# Patient Record
Sex: Female | Born: 1980 | Race: Black or African American | Hispanic: No | Smoking: Current every day smoker
Health system: Southern US, Community
[De-identification: ages and names within clinical notes are randomized; demographics above are authoritative.]

## PROBLEM LIST (undated history)

## (undated) DIAGNOSIS — F419 Anxiety disorder, unspecified: Secondary | ICD-10-CM

## (undated) DIAGNOSIS — R5383 Other fatigue: Secondary | ICD-10-CM

## (undated) DIAGNOSIS — G4720 Circadian rhythm sleep disorder, unspecified type: Secondary | ICD-10-CM

## (undated) DIAGNOSIS — R87610 Atypical squamous cells of undetermined significance on cytologic smear of cervix (ASC-US): Secondary | ICD-10-CM

## (undated) DIAGNOSIS — F329 Major depressive disorder, single episode, unspecified: Secondary | ICD-10-CM

## (undated) DIAGNOSIS — F32A Depression, unspecified: Secondary | ICD-10-CM

## (undated) DIAGNOSIS — F410 Panic disorder [episodic paroxysmal anxiety] without agoraphobia: Secondary | ICD-10-CM

## (undated) HISTORY — DX: Major depressive disorder, single episode, unspecified: F32.9

## (undated) HISTORY — DX: Anxiety disorder, unspecified: F41.9

## (undated) HISTORY — DX: Atypical squamous cells of undetermined significance on cytologic smear of cervix (ASC-US): R87.610

## (undated) HISTORY — DX: Other fatigue: R53.83

## (undated) HISTORY — DX: Circadian rhythm sleep disorder, unspecified type: G47.20

## (undated) HISTORY — DX: Panic disorder (episodic paroxysmal anxiety): F41.0

## (undated) HISTORY — DX: Depression, unspecified: F32.A

---

## 2004-10-20 ENCOUNTER — Emergency Department: Payer: Self-pay | Admitting: Unknown Physician Specialty

## 2006-05-13 ENCOUNTER — Emergency Department: Payer: Self-pay | Admitting: Emergency Medicine

## 2007-08-29 ENCOUNTER — Emergency Department: Payer: Self-pay | Admitting: Internal Medicine

## 2007-08-31 ENCOUNTER — Emergency Department: Payer: Self-pay | Admitting: Internal Medicine

## 2008-07-25 ENCOUNTER — Emergency Department: Payer: Self-pay | Admitting: Emergency Medicine

## 2008-07-29 IMAGING — US US OB < 14 WEEKS - US OB TV
1 series · 17 of 28 positions shown · non-contrast
Comparison: none

REASON FOR EXAM: preg and bleeding
COMMENTS:

[Series 1: us ob < 14 weeks - us ob tv · 17 of 83 slices shown]
[im 1/83]
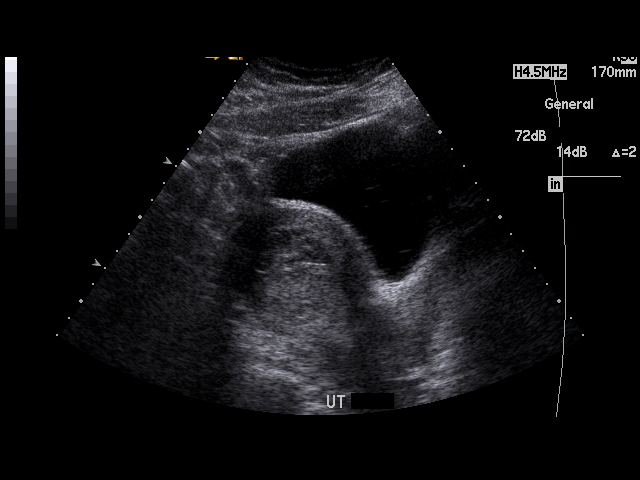
[im 7/83]
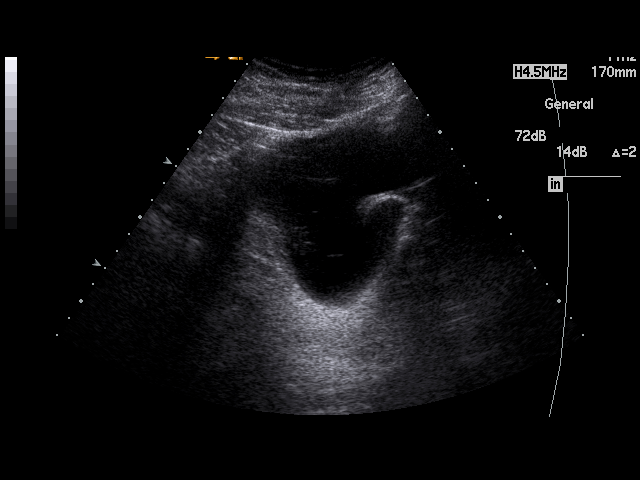
[im 13/83]
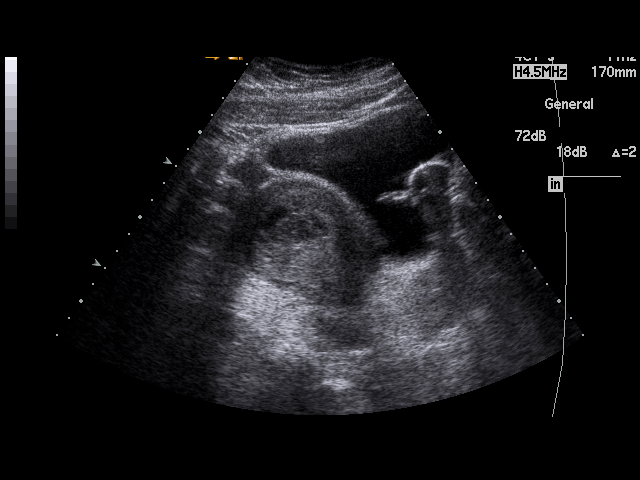
[im 16/83]
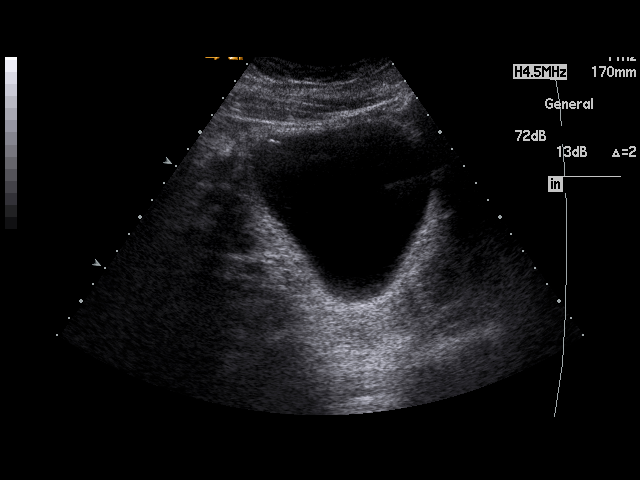
[im 22/83]
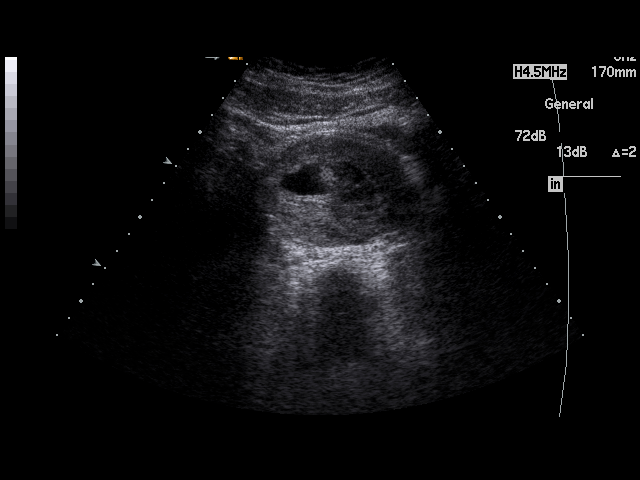
[im 28/83]
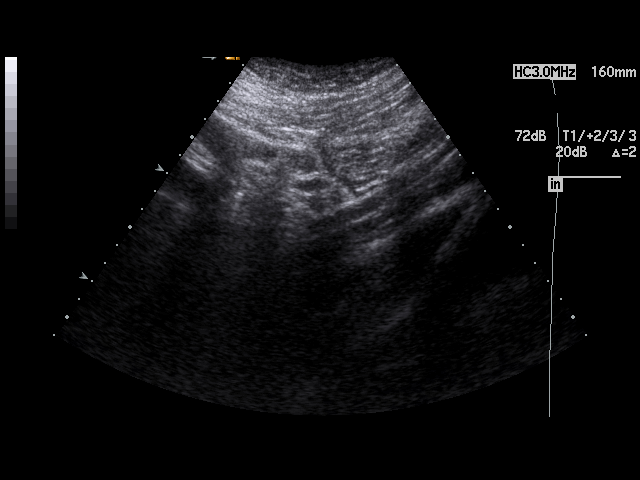
[im 31/83]
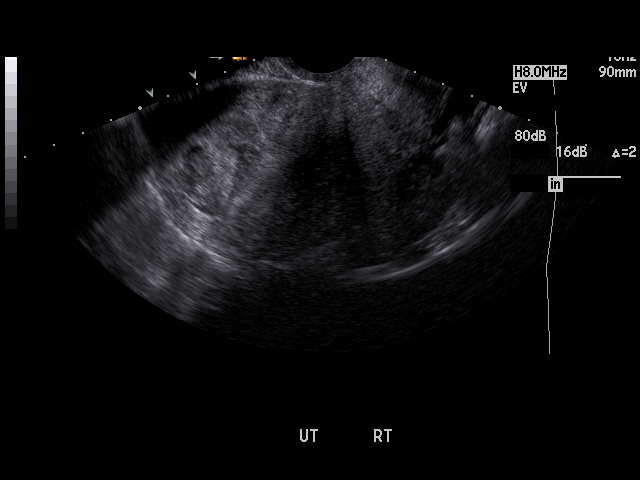
[im 37/83]
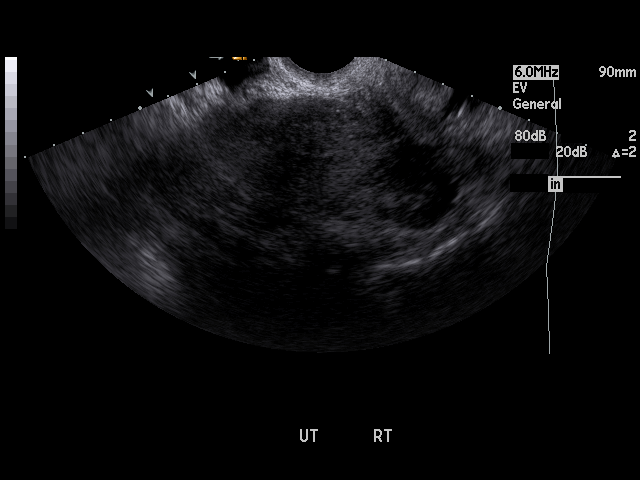
[im 43/83]
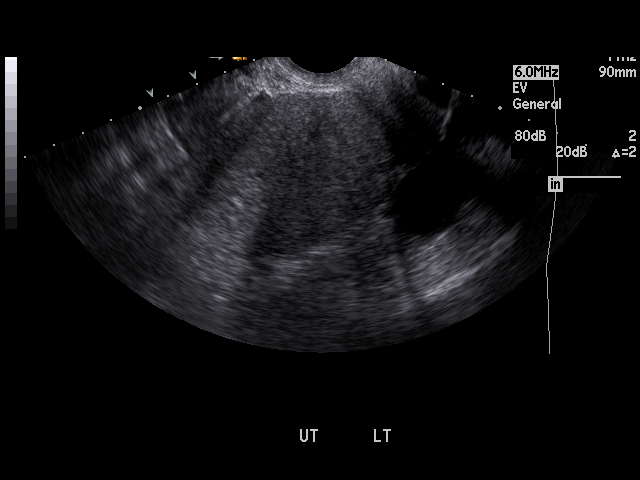
[im 46/83]
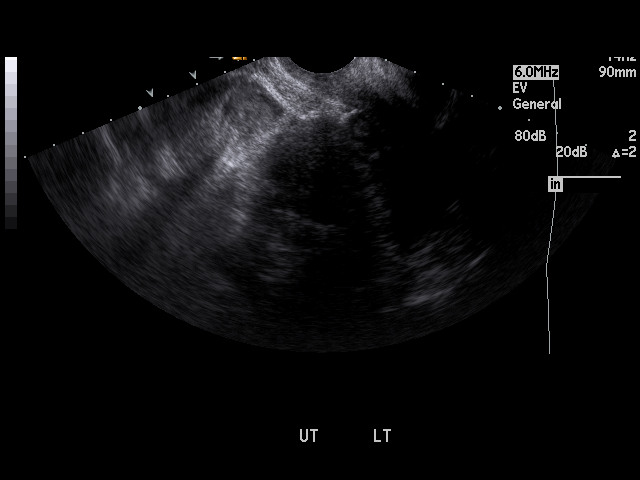
[im 52/83]
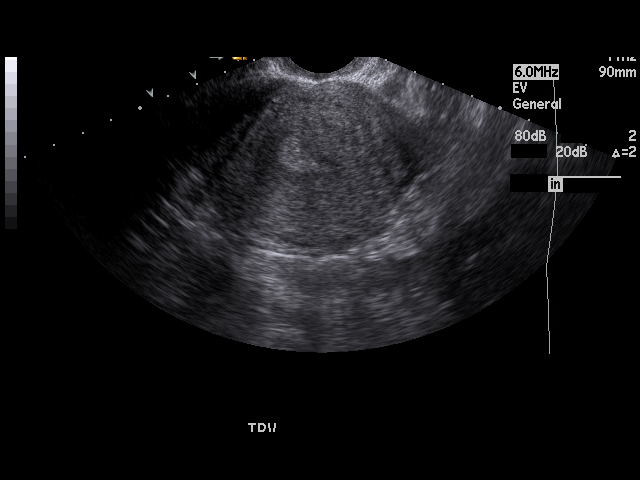
[im 55/83]
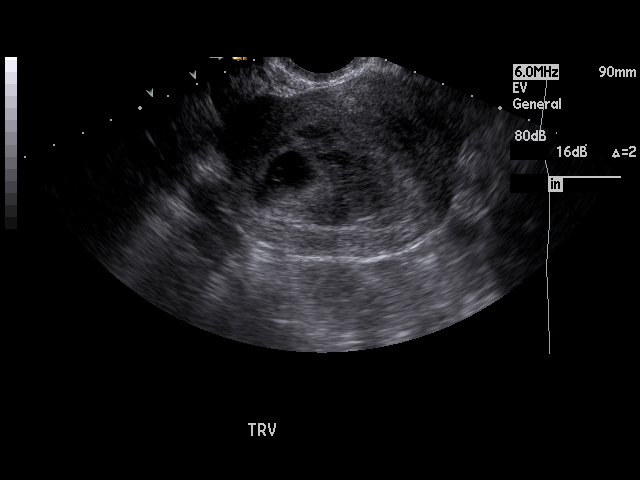
[im 61/83]
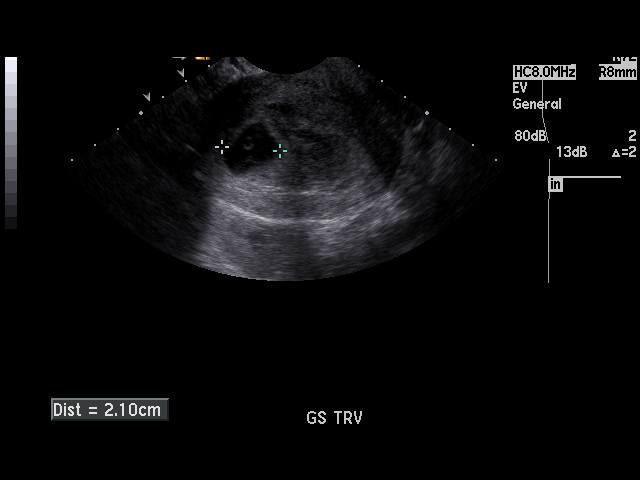
[im 67/83]
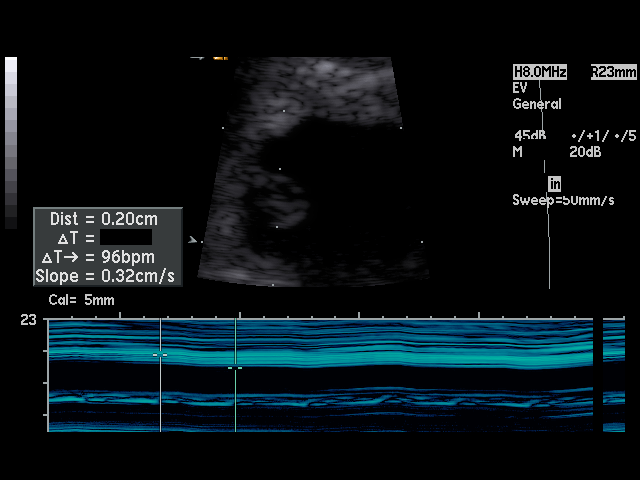
[im 70/83]
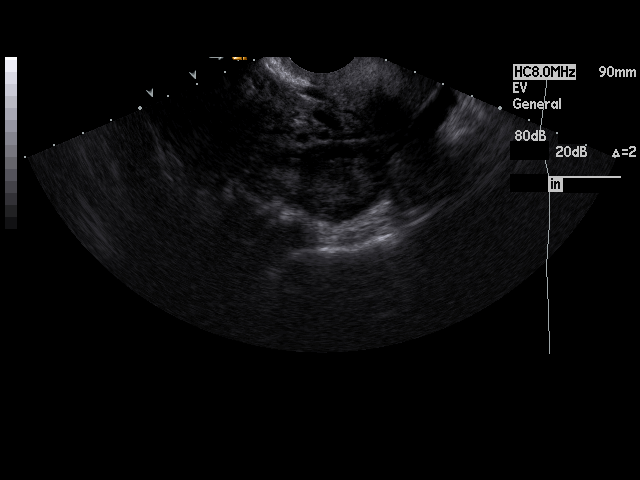
[im 76/83]
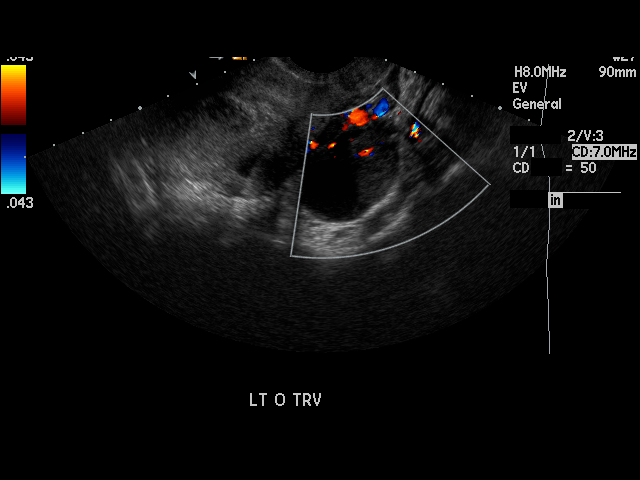
[im 83/83]
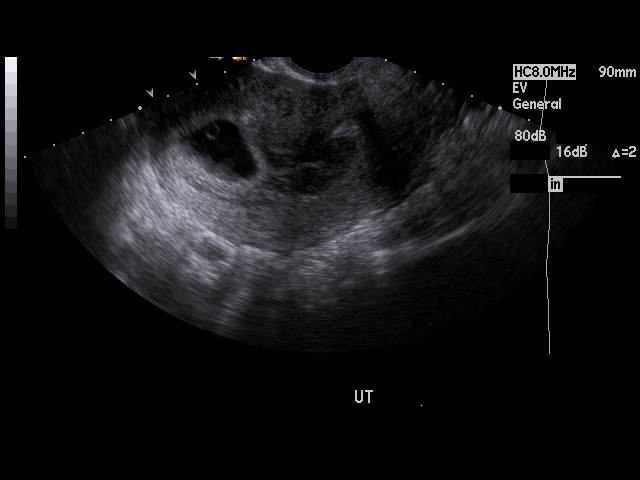

[17 of 28 positions shown; findings below may reference images not displayed]

PROCEDURE:     US  - US OB LESS THAN 14 WEEKS  - August 29, 2007 [DATE]

RESULT:     A single viable intrauterine pregnancy is present with a crown
rump length of 0.3 cm corresponding to 6 weeks.  Yolk sac is identified.
Fetal heart rate is 95 beats per minute. Follow-up is suggested to
demonstrate continued fetal development.  A 1.5 x 1.2 x 1.8 cm uterine
fibroid is noted.  There is a questionable small subchorionic hemorrhage
adjacent to the gestational sac.  Follow-up pelvic ultrasounds are suggested
for continued evaluation. A simple ovarian cyst is noted on the LEFT
measuring approximately 2 cm.
IMPRESSION: 1)Single viable intrauterine pregnancy with small implantation bleed.
Follow-up pelvic ultrasound is suggested.  Gynecologic consultation is
suggested.
The initial report was faxed to the patient's physician at the time of the
study.

## 2009-12-31 ENCOUNTER — Emergency Department: Payer: Self-pay | Admitting: Emergency Medicine

## 2012-08-05 DIAGNOSIS — R87610 Atypical squamous cells of undetermined significance on cytologic smear of cervix (ASC-US): Secondary | ICD-10-CM

## 2012-08-05 HISTORY — DX: Atypical squamous cells of undetermined significance on cytologic smear of cervix (ASC-US): R87.610

## 2013-07-06 ENCOUNTER — Emergency Department: Payer: Self-pay | Admitting: Emergency Medicine

## 2013-07-12 ENCOUNTER — Emergency Department: Payer: Self-pay | Admitting: Internal Medicine

## 2013-08-30 ENCOUNTER — Ambulatory Visit: Payer: Self-pay | Admitting: Podiatry

## 2013-09-05 HISTORY — PX: TUBAL LIGATION: SHX77

## 2014-05-24 ENCOUNTER — Observation Stay: Payer: Self-pay

## 2014-05-24 LAB — URINALYSIS, COMPLETE
BILIRUBIN, UR: NEGATIVE
Blood: NEGATIVE
Leukocyte Esterase: NEGATIVE
Nitrite: NEGATIVE
PH: 5 (ref 4.5–8.0)
Protein: 30
RBC,UR: 3 /HPF (ref 0–5)
SPECIFIC GRAVITY: 1.026 (ref 1.003–1.030)
WBC UR: 2 /HPF (ref 0–5)

## 2014-08-06 ENCOUNTER — Ambulatory Visit: Payer: Self-pay | Admitting: Obstetrics and Gynecology

## 2014-08-06 LAB — CBC WITH DIFFERENTIAL/PLATELET
BASOS ABS: 0.1 10*3/uL (ref 0.0–0.1)
Basophil %: 0.7 %
Eosinophil #: 0 10*3/uL (ref 0.0–0.7)
Eosinophil %: 0.5 %
HCT: 38.4 % (ref 35.0–47.0)
HGB: 12.6 g/dL (ref 12.0–16.0)
LYMPHS ABS: 1.5 10*3/uL (ref 1.0–3.6)
LYMPHS PCT: 19.1 %
MCH: 29.5 pg (ref 26.0–34.0)
MCHC: 32.9 g/dL (ref 32.0–36.0)
MCV: 90 fL (ref 80–100)
Monocyte #: 0.6 x10 3/mm (ref 0.2–0.9)
Monocyte %: 7.5 %
Neutrophil #: 5.5 10*3/uL (ref 1.4–6.5)
Neutrophil %: 72.2 %
PLATELETS: 189 10*3/uL (ref 150–440)
RBC: 4.29 10*6/uL (ref 3.80–5.20)
RDW: 13.8 % (ref 11.5–14.5)
WBC: 7.7 10*3/uL (ref 3.6–11.0)

## 2014-08-07 ENCOUNTER — Inpatient Hospital Stay: Payer: Self-pay | Admitting: Obstetrics and Gynecology

## 2014-08-08 LAB — HEMATOCRIT: HCT: 38.5 % (ref 35.0–47.0)

## 2014-12-29 LAB — SURGICAL PATHOLOGY

## 2014-12-31 NOTE — Op Note (Signed)
PATIENT NAME:  Angela Tapia, Angela Tapia MR#:  [id number] DATE OF BIRTH:  20-Sep-1980  DATE OF PROCEDURE:  08/07/2014  PREOPERATIVE DIAGNOSES:  1.  History of prior Cesarean section.  2.  Obesity in pregnancy.  3.  Desiring permanent sterilization.   POSTOPERATIVE DIAGNOSES:  1.  History of prior Cesarean section.  2.  Obesity in pregnancy.  3.  Desiring permanent sterilization.   OPERATION:  Repeat low transverse Cesarean section with bilateral tubal ligation.   ANESTHESIA: General.   SURGEON: Hildred LaserAnika Arantxa Piercey, MD.   ASSISTANYolanda Bonine: Melody Burr, CNM.    ESTIMATED BLOOD LOSS: 500.   OPERATIVE FLUIDS: 1600 mL.   URINE OUTPUT: 75 mL.   COMPLICATIONS: None.   FINDINGS: Normal appearing uterine outline, tubes, and ovaries.  There was clear amniotic fluid at rupture. Infant was in cephalic presentation in the left occiput anterior position. No nuchal cord was present.   SPECIMEN: Cord blood and portion of right and left fallopian tubes.   CONDITION: Stable.   PROCEDURE: The patient was taken to the operating room where she was placed under spinal anesthesia without difficulty. She was then placed in the supine position with leftward tilt and prepped and draped in normal sterile fashion. After this the spinal anesthesia was tested and found to be adequate. Next a Pfannenstiel skin incision was then made with a scalpel and carried down to the underlying layer of the fascia with the Bovie. The fascia was incised in the midline and the incision was extended laterally with the Mayo scissors. The superior aspect of the fascial incision was then grasped Kober clamps, elevated and the underlying rectus muscles were dissected off bluntly.  Attention was then turned to the inferior aspect of the incision which in a similar fashion was grasped, tented up with Kober clamps, and the rectus muscles were then dissected off bluntly. The rectus muscles were then separated in the midline and the peritoneum was identified  and entered bluntly. The peritoneal incision was then extended superiorly and inferiorly with good visualization of the bladder. The bladder blade was then inserted and the vesicouterine peritoneum was identified, grasped with pickups and entered sharply with the Metzenbaum scissors. The incision was then extended laterally and a bladder flap was created digitally. The bladder blade was then reinserted and the lower uterine segment was incised in transverse fashion with the scalpel. The uterine incision was then extended superiorly bluntly. The bladder blade was then removed and the infant's head was delivered atraumatically. The nose and mouth were suctioned and the remainder of the infant was delivered without difficulty. The cord was clamped and cut. The infant was handed off to the waiting pediatrician. Cord blood was obtained. The placenta was then removed manually and the uterus was exteriorized and cleared of all clots and debris. The uterine incision was then repaired using a 0 Vicryl in a running locked fashion. A second layer of the same suture was then used to obtain excellent hemostasis. Attention was then turned to the fallopian tubes. The left fallopian tube was then grasped with a Babcock clamp and the Babcock clamp was then used to grasp the tube approximately 4 cm from the cornual region. Next a 3 cm segment of tube was then ligated with a free tie of plain gut and excised, this was done in the [place] fashion. Good hemostasis was noted. Attention was then turned to the right fallopian tube which in a similar fashion was then ligated and a 3 cm segment excised. Excellent hemostasis was  noted. Both tubal ostia were cauterized bilaterally. The uterus was then returned to the abdomen. The gutters were cleared of all clots and the fascia was reapproximated with 0 Vicryl in a running fashion. The skin was then closed with 4-0 Monocryl. Dermabond was placed over the incision. Next a Lidoderm patch was  placed above and below the incision. The patient tolerated the procedure well. Sponge, lap, and needle counts were correct x 2.  Two grams of Ancef were given prior to incision. The patient was taken to the recovery room in stable condition.    ____________________________ Jacques Earthly Valentino Saxon, MD asc:bu D: 08/07/2014 17:38:37 ET T: 08/07/2014 18:37:42 ET JOB#: [id number]  cc: Jacques Earthly. Valentino Saxon, MD, <Dictator> Fabian November MD ELECTRONICALLY SIGNED 09/05/2014 9:50

## 2015-01-13 NOTE — H&P (Signed)
L&D Evaluation:  History:  HPI 33yo presents with c/o n/v and low back pain x 36 hours at 28weeks, denies fever or decreased FM; unable to keep anything in orally since yesterday. denies contractions or vaginal d/c or dysuria. Normal PNC to date   Presents with back pain, nausea/vomiting   Patient's Medical History Asthma   Patient's Surgical History Previous C-Section   Medications Pre Natal Vitamins  Tylenol (Acetaminophen)   Allergies NKDA   Social History none   Family History Non-Contributory   ROS:  ROS All systems were reviewed.  HEENT, CNS, GI, GU, Respiratory, CV, Renal and Musculoskeletal systems were found to be normal.   Exam:  Vital Signs stable   Urine Protein not completed   Mental Status clear   Chest clear   Heart normal sinus rhythm   Abdomen gravid, non-tender   Back no CVAT   FHT normal rate with no decels   Fetal Heart Rate 140   Ucx absent   Impression:  Impression dehydration, Gastritis at 28 weeks   Plan:  Plan EFM/NST, fluids, antiemetics   Electronic Signatures: Ines BloomerBurr, Katalina Magri N (CNM)  (Signed 19-Sep-15 11:36)  Authored: L&D Evaluation   Last Updated: 19-Sep-15 11:36 by Ulyses AmorBurr, Greysyn Vanderberg N (CNM)

## 2015-08-12 ENCOUNTER — Other Ambulatory Visit: Payer: Self-pay

## 2015-08-13 ENCOUNTER — Other Ambulatory Visit: Payer: Self-pay

## 2016-06-16 ENCOUNTER — Telehealth: Payer: Self-pay | Admitting: Obstetrics and Gynecology

## 2016-06-16 ENCOUNTER — Encounter: Payer: Self-pay | Admitting: Obstetrics and Gynecology

## 2016-06-16 NOTE — Telephone Encounter (Signed)
Yes tina she may do a drop off ua, just put [facility] nurse schedule and [facility] thanks, Ziyah Cordoba

## 2016-06-16 NOTE — Telephone Encounter (Signed)
Hey Tina yes she can drop off UA in am, just put on nurse schedule and [facility], thanks

## 2016-06-16 NOTE — Telephone Encounter (Signed)
LM for pt letting her know that she can do the UA drop off

## 2016-06-16 NOTE — Telephone Encounter (Signed)
PT CALLED AND SHE THINKS SHE MIGHT HAVE A UTI, SHE IS HAVING LOWER ABDOMEN PAIN, SHE WAS UNABLE TO MAKE IT TODAY AT 9:45 AT THE OPEN SLOT AND THERE IS NOTHING OPEN UNTIL November, SO I DIDN'T KNOW IF SHE COULD COME IN AND DROP OFF HER URINE, SHE SAID SHE CAN DO IT TOMORROW MORNING IF SHE CAN. LET ME KNOW AND I CAN CALL HER BACK.

## 2016-09-13 ENCOUNTER — Encounter: Payer: Self-pay | Admitting: Certified Nurse Midwife

## 2016-09-20 ENCOUNTER — Encounter: Payer: Self-pay | Admitting: Certified Nurse Midwife

## 2016-09-20 ENCOUNTER — Ambulatory Visit (INDEPENDENT_AMBULATORY_CARE_PROVIDER_SITE_OTHER): Payer: Self-pay | Admitting: Certified Nurse Midwife

## 2016-09-20 ENCOUNTER — Other Ambulatory Visit: Payer: Self-pay | Admitting: Certified Nurse Midwife

## 2016-09-20 VITALS — BP 121/78 | HR 81 | Ht 60.0 in | Wt 233.5 lb

## 2016-09-20 DIAGNOSIS — Z01419 Encounter for gynecological examination (general) (routine) without abnormal findings: Secondary | ICD-10-CM

## 2016-09-20 DIAGNOSIS — Z113 Encounter for screening for infections with a predominantly sexual mode of transmission: Secondary | ICD-10-CM

## 2016-09-20 NOTE — Progress Notes (Signed)
ANNUAL PREVENTATIVE CARE GYN  ENCOUNTER NOTE  Subjective:       Angela Tapia is a 36 y.o. [id number] female here for a routine annual gynecologic exam.  Current complaints: fatigue, mood changes, left thigh skin tag and right arm pit swelling.   [name] reports that this year has been busy, but okay. She reports increased fatigue, feeling tired and "not being there" from time to time and requests lab work today.   Her stress level increased when her mother moved in with her for a while, but she has since moved out and things are getting better.   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   She is sexual activity and requests STI testing. Last intercourse 09/19/2016.  She has three children: boys, eight (8) and 613, and a daughter age two (2).   Gynecologic History  Patient's last menstrual period was 08/26/2016 (approximate).   Contraception: tubal ligation   Last Pap: 2015. Results were: abnormal  Obstetric History OB History  Gravida Para Term Preterm AB Living  4       1    SAB TAB Ectopic Multiple Live Births  1 0          # Outcome Date GA Lbr Len/2nd Weight Sex Delivery Anes PTL Lv  4 Gravida 2015     CS-Unspec     3 Gravida 2009    M CS-Unspec     2 Gravida 2004     CS-Unspec     1 SAB               Past Medical History:  Diagnosis Date  . Anxiety   . ASCUS of cervix with negative high risk HPV 08/2012  . Depression   . Disturbed sleep rhythm   . Fatigue   . Panic attack     Past Surgical History:  Procedure Laterality Date  . CESAREAN SECTION  2009  . TUBAL LIGATION  2015    No current outpatient prescriptions on file prior to visit.   No current facility-administered medications on file prior to visit.     Not on File  Social History   Social History  . Marital status: Legally Separated    Spouse name: N/A  . Number of children: N/A  . Years of education: N/A   Occupational History  . Not on file.   Social History Main Topics  .  Smoking status: Current Every Day Smoker    Packs/day: 3.00    Years: 20.00  . Smokeless tobacco: Never Used  . Alcohol use Yes     Comment: rarely  . Drug use: No  . Sexual activity: Yes    Birth control/ protection: Surgical   Other Topics Concern  . Not on file   Social History Narrative  . No narrative on file    Family History  Problem Relation Age of Onset  . Diabetes Mother   . Hypertension Mother   . Heart disease Maternal Grandmother   . Diabetes Maternal Grandmother   . Diabetes Paternal Grandmother     The following portions of the patient's history were reviewed and updated as appropriate: allergies, current medications, past family history, past medical history, past social history, past surgical history and problem list.  Review of Systems ROS  General ROS: negative for - chills,fever, hot flashes, night sweats, weight gain or weight loss  Psychological ROS: negative for - anxiety, decreased libido, depression, physical abuse or sexual abuse  Ophthalmic  ROS: negative for - blurry vision, eye pain or loss of vision  ENT ROS: negative for - headaches, hearing change, visual changes or vocal changes  Hematological and Lymphatic ROS: negative for - bleeding problems, bruising, swollen lymph nodes or weight loss  Endocrine ROS: negative for - galactorrhea, hair pattern changes, hot flashes, palpitations, polydipsia/polyuria,  temperature intolerance or unexpected weight changes  Breast ROS: negative for - new or changing breast lumps or nipple discharge  Respiratory ROS: negative for - cough or shortness of breath  Cardiovascular ROS: negative for - chest pain, irregular heartbeat, palpitations or shortness of breath  Gastrointestinal ROS: no abdominal pain, change in bowel habits, or black or bloody stools  Genito-Urinary ROS: no dysuria, trouble voiding, or hematuria  Musculoskeletal ROS: negative for - joint pain or joint stiffness  Neurological ROS:  negative for - bowel and bladder control changes  Dermatological ROS: negative for rash changes   Objective:   BP 121/78   Pulse 81   Ht 5' (1.524 m)   Wt 233 lb 8 oz (105.9 kg)   LMP 08/26/2016 (Approximate)   BMI 45.60 kg/m    CONSTITUTIONAL: Well-developed, well-nourished female in no acute distress.   PSYCHIATRIC: Normal mood and affect. Normal behavior. Normal judgment and thought content.  NEUROLGIC: Alert and oriented to person, place, and time. Normal muscle tone coordination.  HENT:  Normocephalic, atraumatic.   NECK: Normal range of motion, supple, no masses.  Normal thyroid.   SKIN: Skin is warm and dry. No rash noted. Not diaphoretic. No erythema. No pallor. Flesh colored skin tag to inner left thigh size of the tip of a ballpoint pen.   CARDIOVASCULAR: Normal heart rate noted, regular rhythm, no murmur.  RESPIRATORY: Clear to auscultation bilaterally. Effort and breath sounds normal.  BREASTS: Symmetric in size. No masses, skin changes, nipple drainage, or lymphadenopathy.  ABDOMEN: Soft, normal bowel sounds, no distention noted.  No tenderness, rebound or guarding.   PELVIC:  External Genitalia: Normal  Vagina: Normal, yellow thin discharge present  Cervix: Normal  Uterus: Normal  Adnexa: Normal   MUSCULOSKELETAL: Normal range of motion. No tenderness.    LYMPHATIC: No Supraclavicular, or Inguinal Adenopathy. Right > left axilla tissue  Assessment:   Annual gynecologic examination 36 y.o.   Contraception: tubal ligation   Obesity 2   Problem List Items Addressed This Visit    None    Visit Diagnoses    Well woman exam    -  Primary   Relevant Orders   CBC with Differential/Platelet   Comprehensive metabolic panel   Lipid panel   VITAMIN D 25 Hydroxy (Vit-D Deficiency, Fractures)   Thyroid Panel With TSH   Hemoglobin A1c   Screening for STD (sexually transmitted disease)          Plan:  1. Pap: Pap, Reflex if ASCUS, Pap Co Test and  GC/CT NAAT  2. Labs: Vitamin D, CBC, CMP, Lipid 1, FBS, TSH and Hemoglobin A1C   3. Routine preventative health maintenance measures emphasized: Exercise/Diet/Weight control and Tobacco Warnings  4. Dermatology for skin evaluation  Return to Clinic - 1 Year for annual exam   Gunnar Bulla, CNM

## 2016-09-20 NOTE — Progress Notes (Signed)
Pt is due for pap  smear.

## 2016-09-22 ENCOUNTER — Other Ambulatory Visit: Payer: Self-pay

## 2016-09-23 ENCOUNTER — Other Ambulatory Visit: Payer: Self-pay | Admitting: Certified Nurse Midwife

## 2016-09-23 NOTE — Addendum Note (Signed)
Addended by: Rosine BeatLONTZ, Cadence Haslam L on: 09/23/2016 12:39 PM   Modules accepted: Orders

## 2016-09-26 LAB — CYTOLOGY - PAP

## 2016-09-27 ENCOUNTER — Telehealth: Payer: Self-pay | Admitting: Certified Nurse Midwife

## 2016-09-27 NOTE — Telephone Encounter (Signed)
Called pt with results. No answer, unable to leave voicemail.

## 2016-12-15 ENCOUNTER — Other Ambulatory Visit: Payer: Self-pay

## 2016-12-16 ENCOUNTER — Other Ambulatory Visit: Payer: Self-pay

## 2017-05-26 ENCOUNTER — Telehealth: Payer: Self-pay | Admitting: Certified Nurse Midwife

## 2017-05-26 NOTE — Telephone Encounter (Signed)
Tried to call pt back on cell - caller states wrong number. Lm on home number for pt to call me. Had tdap 05/27/2017. Records printed.

## 2017-05-26 NOTE — Telephone Encounter (Signed)
Patient would like to know if she has has the t-dap shot in the past, and also wants to retrieve her shot records if possible, Patient would like to have a call back from a nurse to discuss her questions, Please advise.

## 2017-05-29 NOTE — Telephone Encounter (Signed)
lmtrc on home number. Cell not correct.

## 2017-05-30 NOTE — Telephone Encounter (Signed)
lmtrc

## 2017-05-31 NOTE — Telephone Encounter (Signed)
Tried to contact pt x3. Chart filed.

## 2019-07-09 ENCOUNTER — Ambulatory Visit: Payer: Self-pay

## 2020-02-07 ENCOUNTER — Other Ambulatory Visit: Payer: Self-pay

## 2020-02-07 ENCOUNTER — Emergency Department
Admission: EM | Admit: 2020-02-07 | Discharge: 2020-02-07 | Disposition: A | Discharge status: Home / Self Care | Payer: BC Managed Care – PPO | Attending: Emergency Medicine | Admitting: Emergency Medicine

## 2020-02-07 DIAGNOSIS — M549 Dorsalgia, unspecified: Secondary | ICD-10-CM | POA: Insufficient documentation

## 2020-02-07 DIAGNOSIS — M545 Low back pain: Secondary | ICD-10-CM | POA: Diagnosis not present

## 2020-02-07 DIAGNOSIS — M25512 Pain in left shoulder: Secondary | ICD-10-CM | POA: Diagnosis not present

## 2020-02-07 DIAGNOSIS — M25511 Pain in right shoulder: Secondary | ICD-10-CM | POA: Insufficient documentation

## 2020-02-07 DIAGNOSIS — Y92410 Unspecified street and highway as the place of occurrence of the external cause: Secondary | ICD-10-CM | POA: Diagnosis not present

## 2020-02-07 DIAGNOSIS — F17200 Nicotine dependence, unspecified, uncomplicated: Secondary | ICD-10-CM | POA: Insufficient documentation

## 2020-02-07 DIAGNOSIS — Y9389 Activity, other specified: Secondary | ICD-10-CM | POA: Diagnosis not present

## 2020-02-07 DIAGNOSIS — M79622 Pain in left upper arm: Secondary | ICD-10-CM | POA: Insufficient documentation

## 2020-02-07 DIAGNOSIS — Y999 Unspecified external cause status: Secondary | ICD-10-CM | POA: Insufficient documentation

## 2020-02-07 DIAGNOSIS — M542 Cervicalgia: Secondary | ICD-10-CM | POA: Insufficient documentation

## 2020-02-07 MED ORDER — CYCLOBENZAPRINE HCL 5 MG PO TABS: ORAL_TABLET | ORAL | 0 refills | Status: DC

## 2020-02-07 MED ORDER — IBUPROFEN 600 MG PO TABS: 600.0000 mg | ORAL_TABLET | Freq: Four times a day (QID) | ORAL | 0 refills | Status: DC | PRN
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
## 2020-02-07 NOTE — ED Notes (Signed)
Pt discharged home after verbalizing understanding of discharge instructions; nad noted. 
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
## 2020-07-29 ENCOUNTER — Other Ambulatory Visit: Payer: Self-pay

## 2020-07-29 ENCOUNTER — Ambulatory Visit
Admission: EM | Admit: 2020-07-29 | Discharge: 2020-07-29 | Disposition: A | Discharge status: Home / Self Care | Payer: Self-pay | Attending: Emergency Medicine | Admitting: Emergency Medicine

## 2020-07-29 DIAGNOSIS — B9689 Other specified bacterial agents as the cause of diseases classified elsewhere: Secondary | ICD-10-CM | POA: Insufficient documentation

## 2020-07-29 DIAGNOSIS — B3731 Acute candidiasis of vulva and vagina: Secondary | ICD-10-CM

## 2020-07-29 DIAGNOSIS — N760 Acute vaginitis: Secondary | ICD-10-CM | POA: Insufficient documentation

## 2020-07-29 DIAGNOSIS — B373 Candidiasis of vulva and vagina: Secondary | ICD-10-CM | POA: Insufficient documentation

## 2020-07-29 DIAGNOSIS — A5901 Trichomonal vulvovaginitis: Secondary | ICD-10-CM | POA: Insufficient documentation

## 2020-07-29 LAB — URINALYSIS, COMPLETE (UACMP) WITH MICROSCOPIC
Bilirubin Urine: NEGATIVE
Glucose, UA: NEGATIVE mg/dL
Hgb urine dipstick: NEGATIVE
Nitrite: NEGATIVE
Protein, ur: NEGATIVE mg/dL
Specific Gravity, Urine: >1.03 — ABNORMAL HIGH (ref 1.005–1.030)
pH: 5.5 (ref 5.0–8.0)

## 2020-07-29 LAB — WET PREP, GENITAL: Sperm: NONE SEEN

## 2020-07-29 MED ORDER — FLUCONAZOLE 200 MG PO TABS: 200.0000 mg | ORAL_TABLET | Freq: Every day | ORAL | 0 refills | Status: AC

## 2020-07-29 MED ORDER — METRONIDAZOLE 500 MG PO TABS: 500.0000 mg | ORAL_TABLET | Freq: Two times a day (BID) | ORAL | 0 refills | Status: AC

## 2020-07-29 NOTE — ED Triage Notes (Signed)
Patient states that she has been having urinary urgency and frequency and painful urination. States that she has also been noticing some vaginal irritation that she noticed mostly last night with intercourse. States that she did use a 1 day monistat around 1 week ago. Reports that itching resolved with monistat.

## 2020-07-29 NOTE — ED Provider Notes (Signed)
[facility]    CSN: [id number] Arrival date & time: 07/29/20  1549      History   Chief Complaint Chief Complaint  Patient presents with  . Urinary Frequency    HPI Angela Tapia is a 39 y.o. female.   HPI   39 year old female here for evaluation of urinary urgency and frequency and vaginal irritation during intercourse.  Patient reports that she noticed the vaginal irritation last night during intercourse.  She has been having a white thin discharge.  She use an over-the-counter yeast treatment 1 week ago but her symptoms continue.  Patient denies any vaginal bleeding, odor to the discharge, fever, abdominal pain, nausea, vomiting, or diarrhea.  Patient has had symptoms for the past 2 weeks.  Past Medical History:  Diagnosis Date  . Anxiety   . ASCUS of cervix with negative high risk HPV 08/2012  . Depression   . Disturbed sleep rhythm   . Fatigue   . Panic attack     There are no problems to display for this patient.   Past Surgical History:  Procedure Laterality Date  . CESAREAN SECTION  2009  . TUBAL LIGATION  2015    OB History    Gravida  4   Para      Term      Preterm      AB  1   Living        SAB  1   TAB  0   Ectopic      Multiple      Live Births               Home Medications    Prior to Admission medications   Medication Sig Start Date End Date Taking? Authorizing Provider  fluconazole (DIFLUCAN) 200 MG tablet Take 1 tablet (200 mg total) by mouth daily for 7 days. 07/29/20 08/05/20  Becky Augusta, NP  metroNIDAZOLE (FLAGYL) 500 MG tablet Take 1 tablet (500 mg total) by mouth 2 (two) times daily. 07/29/20   Becky Augusta, NP    Family History Family History  Problem Relation Age of Onset  . Diabetes Mother   . Hypertension Mother   . Heart disease Maternal Grandmother   . Diabetes Maternal Grandmother   . Diabetes Paternal Grandmother     Social History Social History   Tobacco Use  . Smoking  status: Current Every Day Smoker    Packs/day: 3.00    Years: 20.00    Pack years: 60.00  . Smokeless tobacco: Never Used  Substance Use Topics  . Alcohol use: Yes    Comment: rarely  . Drug use: No     Allergies   Patient has no known allergies.   Review of Systems Review of Systems  Constitutional: Negative for activity change, appetite change and fever.  HENT: Negative for congestion and sinus pain.   Respiratory: Negative for cough, shortness of breath and wheezing.   Cardiovascular: Negative for chest pain.  Gastrointestinal: Negative for abdominal pain, diarrhea, nausea and vomiting.  Genitourinary: Positive for dyspareunia, frequency, urgency and vaginal discharge. Negative for dysuria, hematuria, vaginal bleeding and vaginal pain.  Musculoskeletal: Negative for back pain.  Skin: Negative for rash.  Neurological: Negative for syncope and headaches.  Hematological: Negative.   Psychiatric/Behavioral: Negative.      Physical Exam Triage Vital Signs ED Triage Vitals  Enc Vitals Group     BP 07/29/20 1715 131/80     Pulse Rate 07/29/20 1715  95     Resp 07/29/20 1715 18     Temp 07/29/20 1715 98.7 F (37.1 C)     Temp Source 07/29/20 1715 Oral     SpO2 07/29/20 1715 100 %     Weight 07/29/20 1712 244 lb (110.7 kg)     Height 07/29/20 1712 5\' 4"  (1.626 m)     Head Circumference --      Peak Flow --      Pain Score 07/29/20 1710 10     Pain Loc --      Pain Edu? --      Excl. in GC? --    No data found.  Updated Vital Signs BP 131/80 (BP Location: Left Arm)   Pulse 95   Temp 98.7 F (37.1 C) (Oral)   Resp 18   Ht 5\' 4"  (1.626 m)   Wt 244 lb (110.7 kg)   LMP 07/15/2020   SpO2 100%   BMI 41.88 kg/m   Visual Acuity Right Eye Distance:   Left Eye Distance:   Bilateral Distance:    Right Eye Near:   Left Eye Near:    Bilateral Near:     Physical Exam Vitals and nursing note reviewed.  Constitutional:      General: She is not in acute  distress.    Appearance: Normal appearance. She is not toxic-appearing.  HENT:     Head: Normocephalic and atraumatic.  Eyes:     General: No scleral icterus.    Extraocular Movements: Extraocular movements intact.     Conjunctiva/sclera: Conjunctivae normal.     Pupils: Pupils are equal, round, and reactive to light.  Cardiovascular:     Rate and Rhythm: Normal rate and regular rhythm.     Pulses: Normal pulses.     Heart sounds: Normal heart sounds. No murmur heard.  No gallop.   Pulmonary:     Effort: Pulmonary effort is normal.     Breath sounds: No wheezing, rhonchi or rales.  Abdominal:     Tenderness: There is no right CVA tenderness.  Musculoskeletal:        General: No swelling or tenderness. Normal range of motion.  Skin:    General: Skin is warm and dry.     Capillary Refill: Capillary refill takes less than 2 seconds.     Findings: No bruising, erythema or rash.  Neurological:     General: No focal deficit present.     Mental Status: She is alert and oriented to person, place, and time.  Psychiatric:        Mood and Affect: Mood normal.        Behavior: Behavior normal.        Thought Content: Thought content normal.        Judgment: Judgment normal.      UC Treatments / Results  Labs (all labs ordered are listed, but only abnormal results are displayed) Labs Reviewed  WET PREP, GENITAL - Abnormal; Notable for the following components:      Result Value   Yeast Wet Prep HPF POC PRESENT (*)    Trich, Wet Prep PRESENT (*)    Clue Cells Wet Prep HPF POC PRESENT (*)    WBC, Wet Prep HPF POC FEW (*)    All other components within normal limits  URINALYSIS, COMPLETE (UACMP) WITH MICROSCOPIC - Abnormal; Notable for the following components:   Specific Gravity, Urine >1.030 (*)    Ketones, ur TRACE (*)  Leukocytes,Ua TRACE (*)    Bacteria, UA FEW (*)    All other components within normal limits  CHLAMYDIA/NGC RT PCR ([facility] ONLY)  URINE CULTURE     EKG   Radiology No results found.  Procedures Procedures (including critical care time)  Medications Ordered in UC Medications - No data to display  Initial Impression / Assessment and Plan / UC Course  I have reviewed the triage vital signs and the nursing notes.  Pertinent labs & imaging results that were available during my care of the patient were reviewed by me and considered in my medical decision making (see chart for details).   Patient is here for evaluation of urinary urgency and frequency that is been associated with some vaginal irritation, a white discharge, and painful intercourse.  Patient states she is had symptoms for 2 weeks.  Wet prep shows trichomoniasis, yeast, and BV.  Urinalysis shows few bacteria with trace leukocytes and trace ketones.  Will send UA for culture.  We will treat BV and trichomoniasis with Flagyl twice daily x7 days.  We will treat vaginal candidiasis with Diflucan 1 tablet now and repeat in 1 week.  Patient can follow-up with her primary care provider or OB/GYN for continued or worsening symptoms.   Final Clinical Impressions(s) / UC Diagnoses   Final diagnoses:  Bacterial vaginosis  Vaginal candidiasis  Trichomoniasis of vagina     Discharge Instructions     Take the Flagyl twice daily for 7 days.  Do not drink alcohol while you take this medication because it will cause you to vomit profusely.  Take 1 Diflucan tablet now and repeat in 1 week for treatment of the vaginal yeast infection.  Abstain from sexual intercourse until after you have completed treatment.  Your partner needs to be tested as well and treated if they are also positive for trichomoniasis.  If your symptoms continue or worsen return for reevaluation or follow-up with your primary care physician.    ED Prescriptions    Medication Sig Dispense Auth. Provider   metroNIDAZOLE (FLAGYL) 500 MG tablet Take 1 tablet (500 mg total) by mouth 2 (two) times  daily. 14 tablet Becky Augusta, NP   fluconazole (DIFLUCAN) 200 MG tablet Take 1 tablet (200 mg total) by mouth daily for 7 days. 7 tablet Becky Augusta, NP     PDMP not reviewed this encounter.   Becky Augusta, NP 07/29/20 [phone]

## 2020-07-29 NOTE — Discharge Instructions (Signed)
Take the Flagyl twice daily for 7 days.  Do not drink alcohol while you take this medication because it will cause you to vomit profusely.  Take 1 Diflucan tablet now and repeat in 1 week for treatment of the vaginal yeast infection.  Abstain from sexual intercourse until after you have completed treatment.  Your partner needs to be tested as well and treated if they are also positive for trichomoniasis.  If your symptoms continue or worsen return for reevaluation or follow-up with your primary care physician.

## 2020-07-30 LAB — CHLAMYDIA/NGC RT PCR (ARMC ONLY)
Chlamydia Tr: NOT DETECTED
N gonorrhoeae: NOT DETECTED

## 2020-07-31 LAB — URINE CULTURE
Culture: 7000 — AB
Special Requests: NORMAL

## 2022-04-08 ENCOUNTER — Emergency Department
Admission: EM | Admit: 2022-04-08 | Discharge: 2022-04-08 | Discharge status: Against Medical Advice | Payer: Self-pay | Attending: Emergency Medicine | Admitting: Emergency Medicine

## 2022-04-08 DIAGNOSIS — R42 Dizziness and giddiness: Secondary | ICD-10-CM | POA: Insufficient documentation

## 2022-04-08 DIAGNOSIS — R519 Headache, unspecified: Secondary | ICD-10-CM | POA: Insufficient documentation

## 2022-04-08 DIAGNOSIS — F419 Anxiety disorder, unspecified: Secondary | ICD-10-CM | POA: Insufficient documentation

## 2022-04-08 DIAGNOSIS — Z8616 Personal history of COVID-19: Secondary | ICD-10-CM | POA: Insufficient documentation

## 2022-04-08 DIAGNOSIS — Z5321 Procedure and treatment not carried out due to patient leaving prior to being seen by health care provider: Secondary | ICD-10-CM | POA: Insufficient documentation

## 2022-04-08 NOTE — ED Notes (Signed)
First Nurse Note: Patient to ED via [facility] from home for anxiety that started 2 hours ago. Patient also c/o headache, dizziness, and SOB. Patient did have COVID 2 weeks ago.  HR 98 154/88 100% 98.4 temp

## 2022-04-08 NOTE — ED Notes (Signed)
Patient states she no longer wants to stay. Patient encouraged to stay. Patient alert and oriented/ ambulatory.
